# Patient Record
Sex: Female | Born: 1975 | Race: White | Hispanic: No | Marital: Married | Smoking: Former smoker
Health system: Southern US, Community
[De-identification: ages and names within clinical notes are randomized; demographics above are authoritative.]

## PROBLEM LIST (undated history)

## (undated) HISTORY — PX: STOMACH SURGERY: SHX791

---

## 2019-06-30 ENCOUNTER — Other Ambulatory Visit: Payer: Self-pay

## 2019-06-30 ENCOUNTER — Emergency Department (HOSPITAL_COMMUNITY): Payer: BC Managed Care – PPO

## 2019-06-30 ENCOUNTER — Emergency Department (HOSPITAL_COMMUNITY)
Admission: EM | Admit: 2019-06-30 | Discharge: 2019-07-01 | Disposition: A | Discharge status: Home / Self Care | Payer: BC Managed Care – PPO | Attending: Emergency Medicine | Admitting: Emergency Medicine

## 2019-06-30 DIAGNOSIS — R55 Syncope and collapse: Secondary | ICD-10-CM | POA: Diagnosis not present

## 2019-06-30 DIAGNOSIS — R1012 Left upper quadrant pain: Secondary | ICD-10-CM | POA: Diagnosis not present

## 2019-06-30 DIAGNOSIS — G8918 Other acute postprocedural pain: Secondary | ICD-10-CM | POA: Insufficient documentation

## 2019-06-30 DIAGNOSIS — R0789 Other chest pain: Secondary | ICD-10-CM | POA: Insufficient documentation

## 2019-06-30 LAB — COMPREHENSIVE METABOLIC PANEL
ALT: 34 U/L (ref 0–44)
AST: 44 U/L — ABNORMAL HIGH (ref 15–41)
Albumin: 2.5 g/dL — ABNORMAL LOW (ref 3.5–5.0)
Alkaline Phosphatase: 108 U/L (ref 38–126)
Anion gap: 11 (ref 5–15)
BUN: 12 mg/dL (ref 6–20)
CO2: 23 mmol/L (ref 22–32)
Calcium: 8.5 mg/dL — ABNORMAL LOW (ref 8.9–10.3)
Chloride: 105 mmol/L (ref 98–111)
Creatinine, Ser: 0.63 mg/dL (ref 0.44–1.00)
GFR calc Af Amer: >60 mL/min (ref >60)
GFR calc non Af Amer: >60 mL/min (ref >60)
Glucose, Bld: 102 mg/dL — ABNORMAL HIGH (ref 70–99)
Potassium: 3.8 mmol/L (ref 3.5–5.1)
Sodium: 139 mmol/L (ref 135–145)
Total Bilirubin: 1.8 mg/dL — ABNORMAL HIGH (ref 0.3–1.2)
Total Protein: 6.5 g/dL (ref 6.5–8.1)

## 2019-06-30 LAB — PROTIME-INR
INR: 1.1 (ref 0.8–1.2)
Prothrombin Time: 14.3 seconds (ref 11.4–15.2)

## 2019-06-30 LAB — CBC WITH DIFFERENTIAL/PLATELET
Abs Immature Granulocytes: 0.04 10*3/uL (ref 0.00–0.07)
Basophils Absolute: 0.1 10*3/uL (ref 0.0–0.1)
Basophils Relative: 1 %
Eosinophils Absolute: 0.3 10*3/uL (ref 0.0–0.5)
Eosinophils Relative: 4 %
HCT: 26.6 % — ABNORMAL LOW (ref 36.0–46.0)
Hemoglobin: 8.2 g/dL — ABNORMAL LOW (ref 12.0–15.0)
Immature Granulocytes: 1 %
Lymphocytes Relative: 11 %
Lymphs Abs: 0.8 10*3/uL (ref 0.7–4.0)
MCH: 28.8 pg (ref 26.0–34.0)
MCHC: 30.8 g/dL (ref 30.0–36.0)
MCV: 93.3 fL (ref 80.0–100.0)
Monocytes Absolute: 0.9 10*3/uL (ref 0.1–1.0)
Monocytes Relative: 12 %
Neutro Abs: 5.5 10*3/uL (ref 1.7–7.7)
Neutrophils Relative %: 71 %
Platelets: 508 10*3/uL — ABNORMAL HIGH (ref 150–400)
RBC: 2.85 MIL/uL — ABNORMAL LOW (ref 3.87–5.11)
RDW: 19.1 % — ABNORMAL HIGH (ref 11.5–15.5)
WBC: 7.7 10*3/uL (ref 4.0–10.5)
nRBC: 0.3 % — ABNORMAL HIGH (ref 0.0–0.2)

## 2019-06-30 LAB — APTT: aPTT: 33 seconds (ref 24–36)

## 2019-06-30 LAB — I-STAT BETA HCG BLOOD, ED (MC, WL, AP ONLY): I-stat hCG, quantitative: 10 m[IU]/mL — ABNORMAL HIGH (ref <5)

## 2019-06-30 MED ORDER — MORPHINE SULFATE (PF) 4 MG/ML IV SOLN
8.0000 mg | Freq: Once | INTRAVENOUS | Status: DC
  Filled 2019-06-30: qty 2

## 2019-06-30 MED ORDER — IOHEXOL 350 MG/ML SOLN
75.0000 mL | Freq: Once | INTRAVENOUS | Status: AC | PRN
  Administered 2019-06-30: 23:00:00 100 mL via INTRAVENOUS

## 2019-06-30 MED ORDER — SODIUM CHLORIDE 0.9 % IV BOLUS
1000.0000 mL | Freq: Once | INTRAVENOUS | Status: AC
  Administered 2019-07-01: 1000 mL via INTRAVENOUS

## 2019-06-30 MED ORDER — HYDROMORPHONE HCL 1 MG/ML IJ SOLN
1.0000 mg | Freq: Once | INTRAMUSCULAR | Status: AC
  Administered 2019-06-30: 1 mg via INTRAVENOUS
  Filled 2019-06-30: qty 1

## 2019-06-30 NOTE — ED Provider Notes (Signed)
[facility]   CSN: [id number] Arrival date & time: 06/30/19  1959     History   Chief Complaint Chief Complaint  Patient presents with  . Near Syncope    HPI Joan Guerrero is a 43 y.o. female.     HPI 43 year old comes in a chief complaint of near fainting. She had a recent gastric sleeve procedure at outside hospital.  The surgery was complicated with leak, and patient has multiple J-tube in place along with PICC line for TPN and Zosyn prophylactically.  Patient reports that she was getting out of shower and became dizzy.  She felt like she was going to faint.  The symptoms continued therefore she sat down.  Family reported that once she sat down her skin turned pale and lips turned purple.  Patient at no point lost consciousness, but she did remain dizzy for at least 5 minutes.  She denies any associated chest pain, shortness of breath, palpitations.  Additionally she reports that today she had some blood in her J-tube.  Typically she does not get any bloody discharge.  She also indicates having some more pain in her left lower chest-upper abdomen.  Patient denies any history of PE, DVT but admits that she has not been very mobile because of the tubes.  No past medical history on file.  There are no active problems to display for this patient.   OB History   No obstetric history on file.      Home Medications    Prior to Admission medications   Not on File    Family History No family history on file.  Social History Social History   Tobacco Use  . Smoking status: Not on file  Substance Use Topics  . Alcohol use: Not on file  . Drug use: Not on file     Allergies   Patient has no known allergies.   Review of Systems Review of Systems  Constitutional: Positive for activity change.  Respiratory: Positive for shortness of breath.   Cardiovascular: Positive for chest pain.  Gastrointestinal:  Positive for abdominal pain and nausea.  Allergic/Immunologic: Negative for immunocompromised state.  Neurological: Positive for dizziness. Negative for syncope.  Hematological: Does not bruise/bleed easily.  All other systems reviewed and are negative.    Physical Exam Updated Vital Signs BP 130/66   Pulse 94   Temp 99.1 F (37.3 C) (Oral)   Resp 20   SpO2 99%   Physical Exam Vitals signs and nursing note reviewed.  Constitutional:      Appearance: She is well-developed.  HENT:     Head: Normocephalic and atraumatic.  Eyes:     Extraocular Movements: Extraocular movements intact.     Pupils: Pupils are equal, round, and reactive to light.  Neck:     Musculoskeletal: Normal range of motion and neck supple.  Cardiovascular:     Rate and Rhythm: Tachycardia present.  Pulmonary:     Effort: Pulmonary effort is normal.  Abdominal:     General: Bowel sounds are normal.     Tenderness: There is abdominal tenderness.     Comments: Patient has 2 J-tubes, no bloody fluid noted.  J-tube has exudative type fluid.  Skin:    General: Skin is warm and dry.  Neurological:     Mental Status: She is alert and oriented to person, place, and time.      ED Treatments / Results  Labs (all labs ordered are  listed, but only abnormal results are displayed) Labs Reviewed  CBC WITH DIFFERENTIAL/PLATELET  COMPREHENSIVE METABOLIC PANEL  APTT  PROTIME-INR  I-STAT BETA HCG BLOOD, ED ([facility], [facility], [facility] ONLY)    EKG EKG Interpretation  Date/Time:  Thursday June 30 2019 20:58:17 EDT Ventricular Rate:  94 PR Interval:    QRS Duration: 93 QT Interval:  377 QTC Calculation: 472 R Axis:   21 Text Interpretation:  Sinus rhythm No acute changes No old tracing to compare Confirmed by Varney Biles [phone]) on 06/30/2019 9:31:22 PM   Radiology No results found.  Procedures Procedures (including critical care time)  Medications Ordered in ED Medications - No data to display    Initial Impression / Assessment and Plan / ED Course  I have reviewed the triage vital signs and the nursing notes.  Pertinent labs & imaging results that were available during my care of the patient were reviewed by me and considered in my medical decision making (see chart for details).       43 year old comes in a chief complaint of dizziness and near fainting.  She has no significant medical history but had a recent gastric sleeve placement with subsequent complication of leak.  She has a PICC line in place and gets TPN.  She still has 2J tubes in place and reports that 1 of them had bloody discharge earlier today.  She also has new pain in the left upper part of her abdomen.  Patient reports that she normally gets about 250 cc drained every 8 hours from her J-tube.  That is significant insensible fluid loss and could have caused patient to get orthostatic hypotension.  She has required blood transfusion in the past therefore there could also be anemia.  Finally, with her recent surgery and immobilization PE is also in the differential.  Plan is to get basic labs. She is having new abdominal pain therefore we will get CT angios chest and CT abdomen and pelvis to ensure there is no abscess, new leak, PE.  Final Clinical Impressions(s) / ED Diagnoses   Final diagnoses:  None    ED Discharge Orders    None       Varney Biles, MD 06/30/19 2214

## 2019-06-30 NOTE — ED Triage Notes (Signed)
  Patient BIB EMS for syncopal episode that occurred about an hour ago.  Patient had gastric sleeve surgery on 7/2 and needed a revision 10 days later.  Two drains were placed and patient has had home healthcare done.  Patient was seen two weeks ago at [facility] and had chest tube place.  Patient states she was getting out of the shower and felt dizzy and almost passed out.  Patient has been NPO for several weeks and has been getting TPN and IV antibiotics through a PICC line.  Patient is A&O x4.  Pain 8/10 on L side.

## 2019-06-30 NOTE — ED Provider Notes (Signed)
Plan at f/u is to check CT imaging If negative she can be discharged    Ripley Fraise, MD 06/30/19 2322

## 2019-07-01 MED ORDER — KETOROLAC TROMETHAMINE 30 MG/ML IJ SOLN
30.0000 mg | Freq: Once | INTRAMUSCULAR | Status: AC
  Administered 2019-07-01: 30 mg via INTRAVENOUS
  Filled 2019-07-01: qty 1

## 2019-07-01 NOTE — Discharge Instructions (Addendum)
Please call your surgeon for follow-up and monitoring of your hemoglobin levels

## 2019-07-01 NOTE — ED Provider Notes (Signed)
No acute findings on CT imaging.  Patient is no acute distress.  Patient reports she has been ambulating. No PE found.  No acute abdominal issues.  Patient will follow with her surgeon.  Her anemia is not new, but has gradually been trending down since July.  Advise follow-up for this but I doubt this is the cause of her symptoms. Patient agreeable with plan   Ripley Fraise, MD 07/01/19 [phone]

## 2020-10-31 IMAGING — CT CT ABD-PELV W/ CM
2 of 5 series · 16 of 46 positions shown, 18 images · IV contrast (omnipaque)
Comparison: None.

CLINICAL DATA: Syncope. Recent gastric sleeve surgery and revision.

EXAM:
CT ANGIOGRAPHY CHEST
CT ABDOMEN AND PELVIS WITH CONTRAST
TECHNIQUE: Multidetector CT imaging of the chest was performed using the
standard protocol during bolus administration of intravenous
contrast. Multiplanar CT image reconstructions and MIPs were
obtained to evaluate the vascular anatomy. Multidetector CT imaging
of the abdomen and pelvis was performed using the standard protocol
during bolus administration of intravenous contrast.
CONTRAST:  75 mL OMNIPAQUE IOHEXOL 350 MG/ML SOLN

[Series 6: a/p w/ 5mm · axial · 0.98mm/px · z∈[+868,+1318]mm · 13 of 102 slices shown, 15 images]
[im 6/102  soft-tissue]
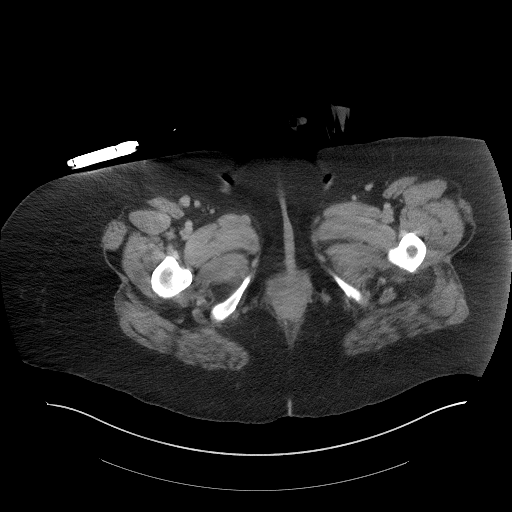
[im 6/102  bone]
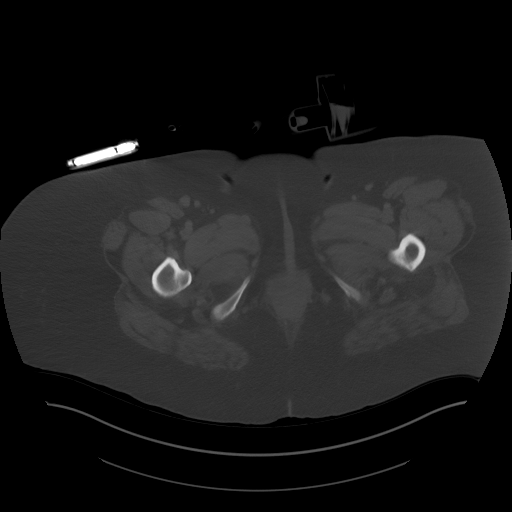
[im 12/102  soft-tissue]
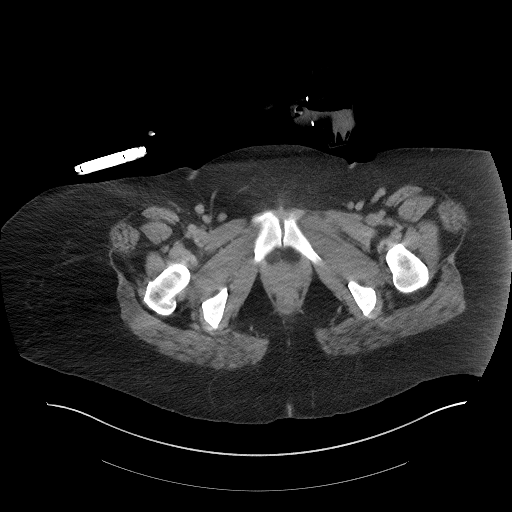
[im 23/102  soft-tissue]
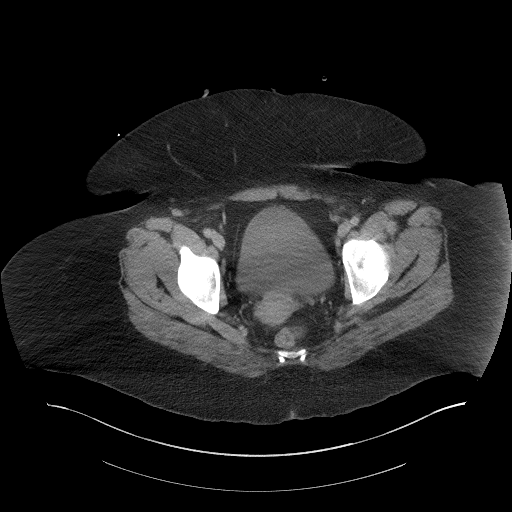
[im 29/102  soft-tissue]
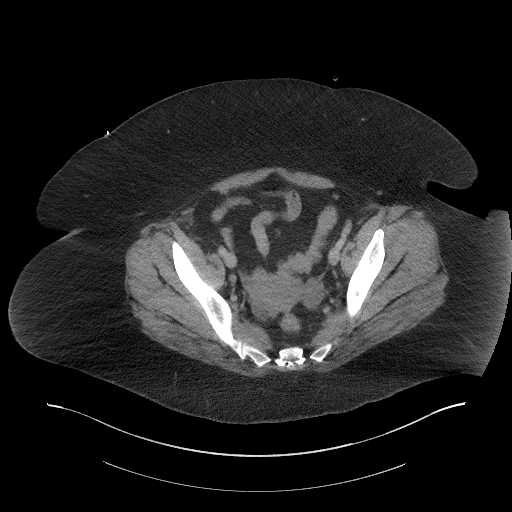
[im 34/102  soft-tissue]
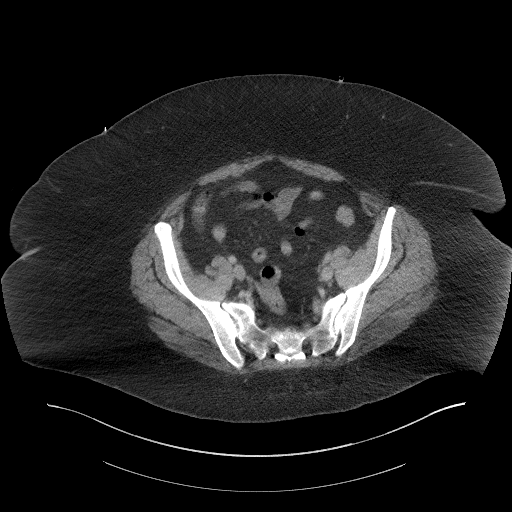
[im 45/102  soft-tissue]
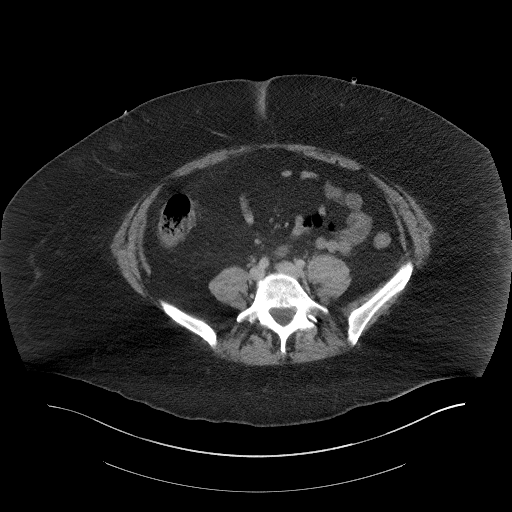
[im 51/102  soft-tissue]
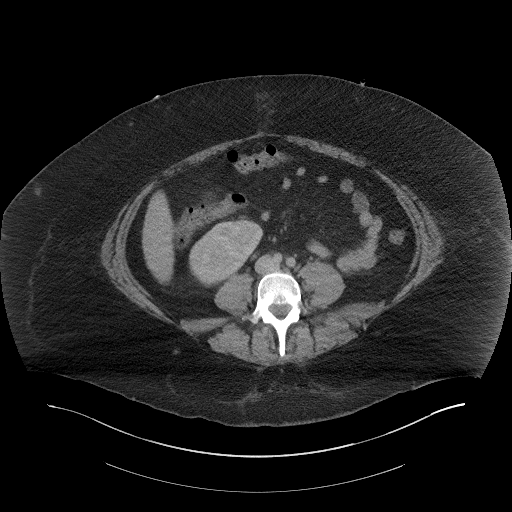
[im 57/102  soft-tissue]
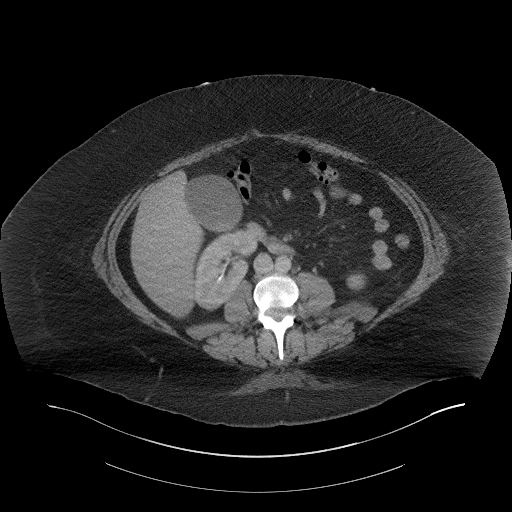
[im 68/102  soft-tissue]
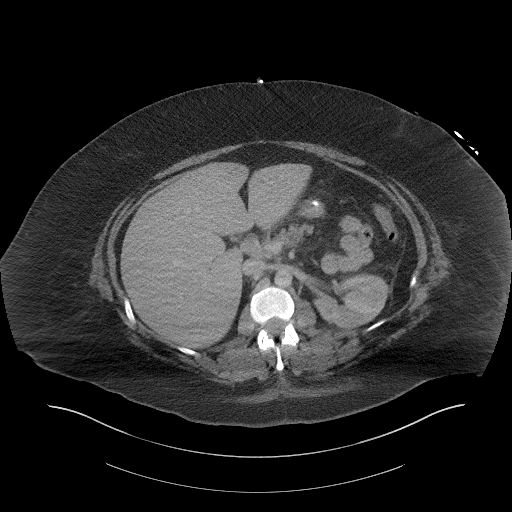
[im 68/102  bone]
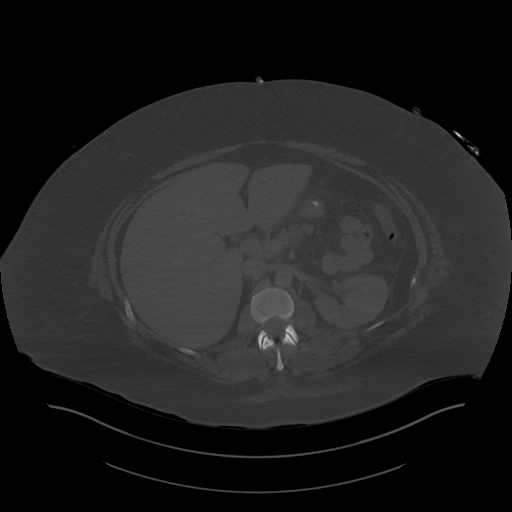
[im 73/102  soft-tissue]
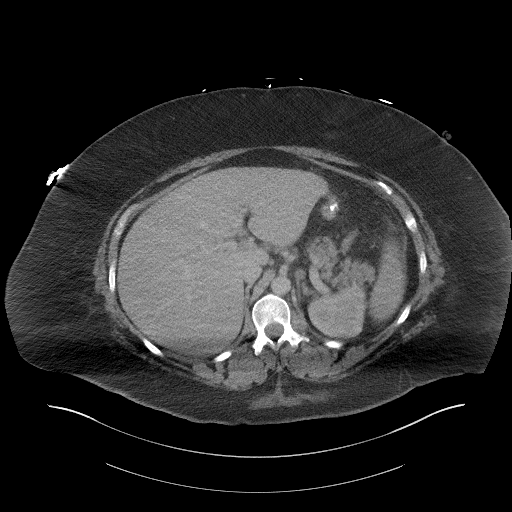
[im 79/102  soft-tissue]
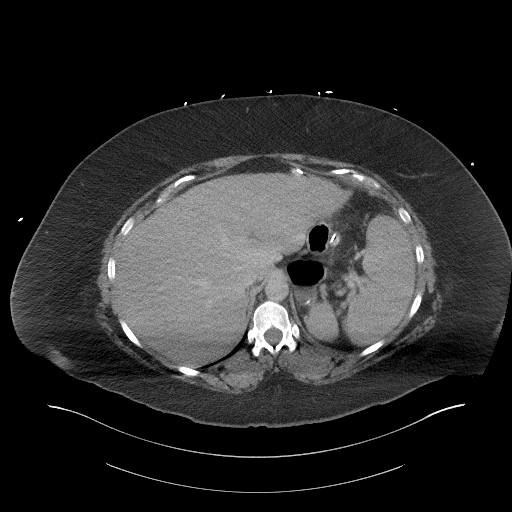
[im 90/102  soft-tissue]
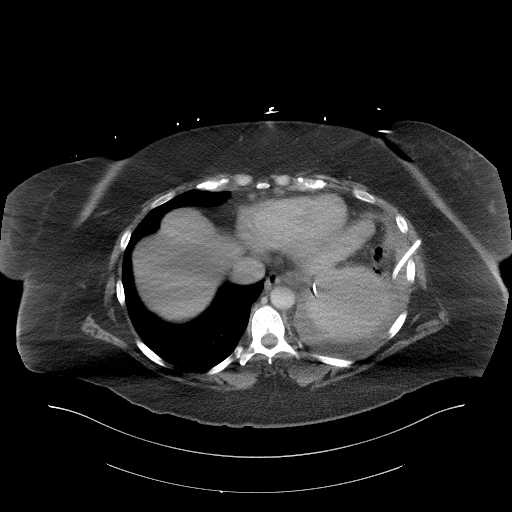
[im 96/102  soft-tissue]
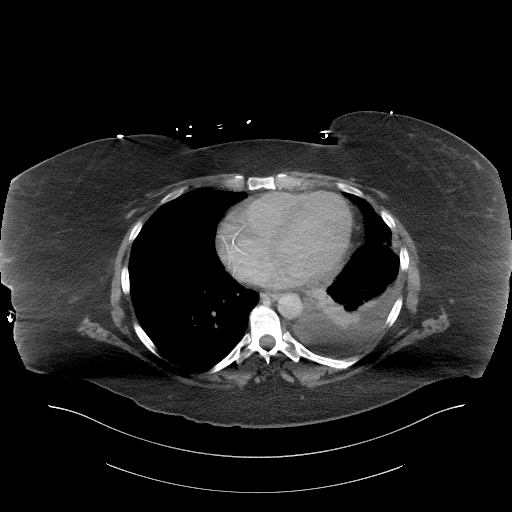

[Series 9: a/p w/ cor · coronal · 1.00mm/px · 3 of 195 slices shown]
[im 65/195  soft-tissue]
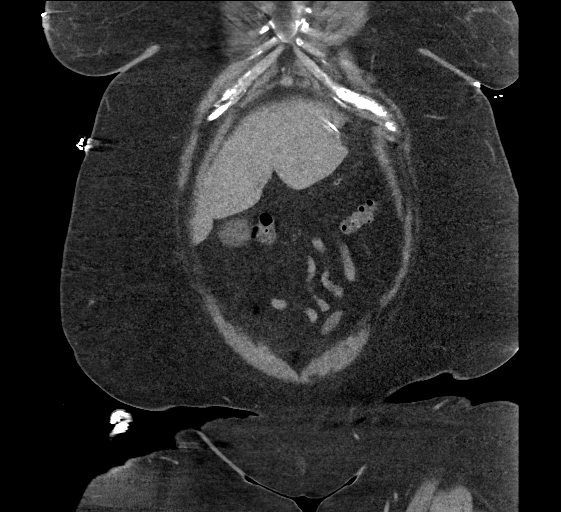
[im 87/195  soft-tissue]
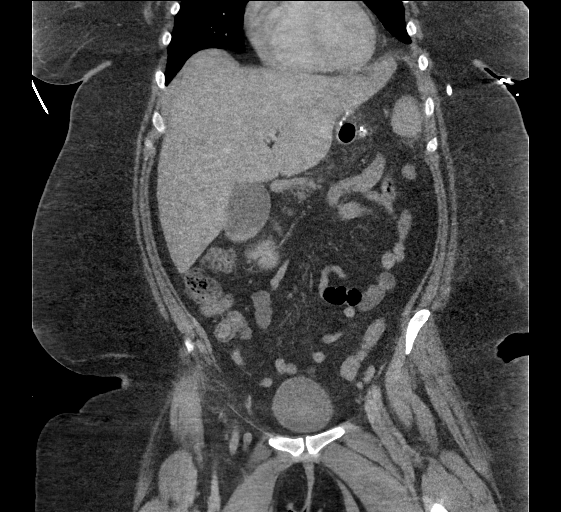
[im 108/195  soft-tissue]
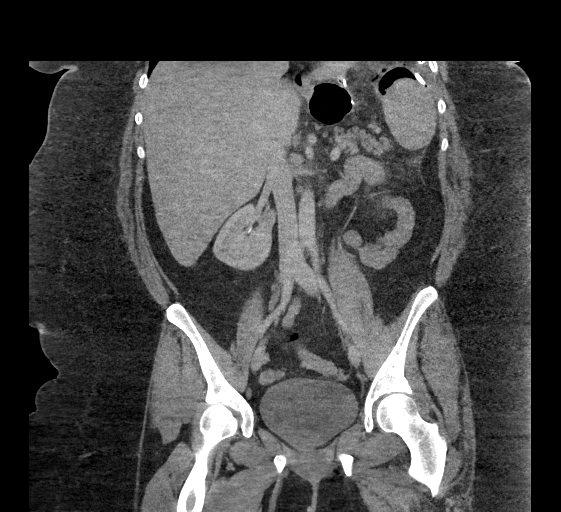

[16 of 46 positions shown; findings below may reference images not displayed]

FINDINGS: CTA CHEST FINDINGS

Cardiovascular: Heart is normal size. Aorta is normal caliber. No
filling defects in the pulmonary arteries to suggest pulmonary
emboli.

Mediastinum/Nodes: Borderline sized axillary lymph nodes
bilaterally, likely reactive. No mediastinal or hilar adenopathy

Lungs/Pleura: Moderate sized left pleural effusion. Compressive
atelectasis in the left lower lobe. Right lung clear.

Musculoskeletal: Chest wall soft tissues are unremarkable. No acute
bony abnormality.

Review of the MIP images confirms the above findings.

CT ABDOMEN and PELVIS FINDINGS

Hepatobiliary: No focal hepatic abnormality. Gallbladder
unremarkable.

Pancreas: No focal abnormality or ductal dilatation.

Spleen: No focal abnormality.  Normal size.

Adrenals/Urinary Tract: No adrenal abnormality. No focal renal
abnormality. No stones or hydronephrosis. Urinary bladder is
unremarkable.

Stomach/Bowel: Normal appendix. Changes of gastric bypass. No
complicating feature. Small bowel and large bowel decompressed,
unremarkable.

Vascular/Lymphatic: No evidence of aneurysm or adenopathy.

Reproductive: Uterus and adnexa unremarkable.  No mass.

Other: Surgical drains are noted in the left upper quadrant. There
is gas around the spleen and left upper quadrant drain, presumably
related to flushing or introduced via the drains. No free fluid or
free air.

Musculoskeletal: No acute bony abnormality.

Review of the MIP images confirms the above findings.
IMPRESSION: No evidence of pulmonary embolus.

Moderate left pleural effusion and left lower lobe atelectasis.

Postoperative changes from gastric bypass. Surgical drains are noted
in the left upper quadrant with gas seen in the left upper quadrant
near the spleen and surgical drains, presumably introduced via the
drains.

## 2021-08-28 ENCOUNTER — Other Ambulatory Visit: Payer: Self-pay

## 2021-08-28 ENCOUNTER — Ambulatory Visit
Admission: EM | Admit: 2021-08-28 | Discharge: 2021-08-28 | Disposition: A | Discharge status: Home / Self Care | Payer: BC Managed Care – PPO | Attending: Internal Medicine | Admitting: Internal Medicine

## 2021-08-28 ENCOUNTER — Encounter: Payer: Self-pay | Admitting: Emergency Medicine

## 2021-08-28 DIAGNOSIS — S46812A Strain of other muscles, fascia and tendons at shoulder and upper arm level, left arm, initial encounter: Secondary | ICD-10-CM

## 2021-08-28 DIAGNOSIS — M542 Cervicalgia: Secondary | ICD-10-CM | POA: Diagnosis not present

## 2021-08-28 MED ORDER — CYCLOBENZAPRINE HCL 5 MG PO TABS: 5.0000 mg | ORAL_TABLET | Freq: Two times a day (BID) | ORAL | 0 refills | Status: AC | PRN

## 2021-08-28 MED ORDER — METHYLPREDNISOLONE SODIUM SUCC 125 MG IJ SOLR
60.0000 mg | Freq: Once | INTRAMUSCULAR | Status: AC
  Administered 2021-08-28: 60 mg via INTRAMUSCULAR

## 2021-08-28 NOTE — ED Provider Notes (Signed)
[facility]    CSN: [id number] Arrival date & time: 08/28/21  3500      History   Chief Complaint Chief Complaint  Patient presents with   Neck Injury    HPI Joan Guerrero is a 45 y.o. female.   Patient presents with left-sided neck pain that radiates into left upper back that started around 4 AM this morning and woke her up out of her sleep.  Denies any apparent injury or any chronic issues with pain in that area.  Patient reports that movement of the neck exacerbates pain.  Patient has taken ibuprofen and Tylenol with no relief in pain.  Denies any numbness or tingling.  Pain does not radiate into left arm and patient denies chest pain or shortness of breath.   Neck Injury   No past medical history on file.  There are no problems to display for this patient.   All pertinent medical history reviewed.  OB History   No obstetric history on file.      Home Medications    Prior to Admission medications   Medication Sig Start Date End Date Taking? Authorizing Provider  cyclobenzaprine (FLEXERIL) 5 MG tablet Take 1 tablet (5 mg total) by mouth 2 (two) times daily as needed for muscle spasms. 08/28/21  Yes Aleeha Boline, Rolly Salter E, FNP  acetaminophen (OFIRMEV) 10 MG/ML SOLN 1,000 mg See admin instructions. 1,000 via PICC line every eight hours    [provider]  Homeopathic Products [facility] COLD REMEDY NA) Place 1 spray into the nose daily.    [provider]  ketorolac (TORADOL) 30 MG/ML injection 30 mg See admin instructions. 30 mg via PICC line every 6-8 hours 06/25/19   [provider]  ondansetron (ZOFRAN-ODT) 8 MG disintegrating tablet Take 8 mg by mouth every 6 (six) hours as needed for nausea or vomiting (DISSOLVE IN THE MOUTH).     [provider]  pantoprazole sodium (PROTONIX) 40 mg/20 mL PACK 40 mg See admin instructions. 40 mg via PICC line in the morning    [provider]  piperacillin-tazobactam (ZOSYN) 3.375  (3-0.375) g injection 3.375 g See admin instructions. 3.375 g via PICC line every eight hours    [provider]  VENTOLIN HFA 108 (90 Base) MCG/ACT inhaler Inhale 2 puffs into the lungs every 6 (six) hours as needed for wheezing or shortness of breath.  06/28/19   [provider]    Family History No family history on file.  Social History Social History   Tobacco Use   Smoking status: Former    Types: Cigarettes   Smokeless tobacco: Never     Allergies   Shellfish-derived products, Erythromycin, and Morphine and related   Review of Systems Review of Systems Per HPI  Physical Exam Triage Vital Signs ED Triage Vitals  Enc Vitals Group     BP 08/28/21 0841 (!) 157/92     Pulse Rate 08/28/21 0841 76     Resp 08/28/21 0841 16     Temp 08/28/21 0841 98.1 F (36.7 C)     Temp Source 08/28/21 0841 Oral     SpO2 08/28/21 0841 97 %     Weight --      Height --      Head Circumference --      Peak Flow --      Pain Score 08/28/21 0845 9     Pain Loc --      Pain Edu? --  Excl. in GC? --    No data found.  Updated Vital Signs BP (!) 157/92 (BP Location: Left Arm)   Pulse 76   Temp 98.1 F (36.7 C) (Oral)   Resp 16   SpO2 97%   Visual Acuity Right Eye Distance:   Left Eye Distance:   Bilateral Distance:    Right Eye Near:   Left Eye Near:    Bilateral Near:     Physical Exam Constitutional:      General: She is not in acute distress.    Appearance: Normal appearance. She is not toxic-appearing or diaphoretic.  HENT:     Head: Normocephalic and atraumatic.  Eyes:     Extraocular Movements: Extraocular movements intact.     Conjunctiva/sclera: Conjunctivae normal.  Pulmonary:     Effort: Pulmonary effort is normal.  Musculoskeletal:     Cervical back: No edema, erythema or crepitus. Pain with movement and muscular tenderness present. No spinous process tenderness. Decreased range of motion.     Thoracic back: Tenderness present. No  bony tenderness.     Lumbar back: Normal.     Comments: Tenderness to palpation to musculature of left neck that radiates into left upper back that correlates with left trapezius muscle.  No step-off or direct spinal tenderness.  No tenderness palpation to shoulder.  Grip strength 5/5.  Neurovascular intact.  Neurological:     General: No focal deficit present.     Mental Status: She is alert and oriented to person, place, and time. Mental status is at baseline.  Psychiatric:        Mood and Affect: Mood normal.        Behavior: Behavior normal.        Thought Content: Thought content normal.        Judgment: Judgment normal.     UC Treatments / Results  Labs (all labs ordered are listed, but only abnormal results are displayed) Labs Reviewed - No data to display  EKG   Radiology No results found.  Procedures Procedures (including critical care time)  Medications Ordered in UC Medications  methylPREDNISolone sodium succinate (SOLU-MEDROL) 125 mg/2 mL injection 60 mg (60 mg Intramuscular Given 08/28/21 0905)    Initial Impression / Assessment and Plan / UC Course  I have reviewed the triage vital signs and the nursing notes.  Pertinent labs & imaging results that were available during my care of the patient were reviewed by me and considered in my medical decision making (see chart for details).     The pain appears to be associated with muscle strain.  Limited options on pain management given patient's history of gastric sleeve surgery.  Steroid injection administered in urgent care today.  Flexeril prescribed to take as needed as muscle relaxer.  Advised patient that muscle relaxer can cause drowsiness.  Patient reports that she has taken muscle relaxer safely before.  Patient to alternate ice and heat application as well.  No red flags on exam.  Discussed return precautions.  Patient verbalized understanding and was agreeable with plan. Final Clinical Impressions(s) / UC  Diagnoses   Final diagnoses:  Trapezius strain, left, initial encounter  Neck pain     Discharge Instructions      It appears that you have a strain of your trapezius muscle of your neck/back.  Steroid injection was administered in urgent care today.  This will hopefully decrease inflammation.  You have also been prescribed a muscle relaxer.  Please be advised that  this muscle relaxer can cause drowsiness, so do not drive or operate machinery while taking this medication.  Follow-up with primary care or provided contact information for orthopedist if pain persists.  Go to the hospital if you develop chest pain, shortness of breath, or if symptoms severely worsen.     ED Prescriptions     Medication Sig Dispense Auth. Provider   cyclobenzaprine (FLEXERIL) 5 MG tablet Take 1 tablet (5 mg total) by mouth 2 (two) times daily as needed for muscle spasms. 15 tablet [place], [place] E, [place]      I have reviewed the PDMP during this encounter.   Gustavus Bryant, [place] 08/28/21 [phone]

## 2021-08-28 NOTE — Discharge Instructions (Signed)
It appears that you have a strain of your trapezius muscle of your neck/back.  Steroid injection was administered in urgent care today.  This will hopefully decrease inflammation.  You have also been prescribed a muscle relaxer.  Please be advised that this muscle relaxer can cause drowsiness, so do not drive or operate machinery while taking this medication.  Follow-up with primary care or provided contact information for orthopedist if pain persists.  Go to the hospital if you develop chest pain, shortness of breath, or if symptoms severely worsen.

## 2021-08-28 NOTE — ED Triage Notes (Signed)
Neck pain starting this morning at 4 am, woke up out of sleep. Starts in left upper back, radiates into neck, and left shoulder. Turning neck worsens pain.

## 2023-06-29 ENCOUNTER — Encounter: Payer: Self-pay | Admitting: Podiatry

## 2023-06-29 ENCOUNTER — Ambulatory Visit (INDEPENDENT_AMBULATORY_CARE_PROVIDER_SITE_OTHER): Admitting: Podiatry

## 2023-06-29 ENCOUNTER — Ambulatory Visit: Admitting: Podiatry

## 2023-06-29 DIAGNOSIS — L600 Ingrowing nail: Secondary | ICD-10-CM | POA: Diagnosis not present

## 2023-06-29 DIAGNOSIS — B351 Tinea unguium: Secondary | ICD-10-CM

## 2023-06-29 NOTE — Progress Notes (Signed)
Subjective:   Patient ID: Joan Guerrero, female   DOB: 47 y.o.   MRN: [id number]   HPI Patient presents with chronic ingrown toenail deformity left big toe and also damaged nailbeds of both feet with concerns about fungus.  Patient does not smoke likes to be active   Review of Systems  All other systems reviewed and are negative.       Objective:  Physical Exam Vitals and nursing note reviewed.  Constitutional:      Appearance: She is well-developed.  Pulmonary:     Effort: Pulmonary effort is normal.  Musculoskeletal:        General: Normal range of motion.  Skin:    General: Skin is warm.  Neurological:     Mental Status: She is alert.     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  I went ahead today and I allowed her to read then signed consent form and I anesthetized the left big toe 60 mg like Marcaine mixture sterile prep done using sterile instrumentation remove the border exposed matrix applied phenol 3 applications 30 seconds followed by alcohol lavage sterile dressing instructed on soaks wear dressings 24 hours take it off earlier if throbbing were to occur and encouraged her to call with questions concerns which may arise

## 2023-06-29 NOTE — Patient Instructions (Signed)
